# Patient Record
Sex: Male | Born: 1970 | Race: White | Hispanic: No | Marital: Single | Smoking: Current some day smoker
Health system: Southern US, Community
[De-identification: ages and names within clinical notes are randomized; demographics above are authoritative.]

## PROBLEM LIST (undated history)

## (undated) HISTORY — PX: SHOULDER ARTHROSCOPY: SHX128

---

## 2006-12-05 ENCOUNTER — Inpatient Hospital Stay (HOSPITAL_COMMUNITY): Admission: AD | Admit: 2006-12-05 | Discharge: 2006-12-09 | Payer: Self-pay | Admitting: Psychiatry

## 2006-12-06 ENCOUNTER — Ambulatory Visit: Payer: Self-pay | Admitting: Psychiatry

## 2007-01-14 ENCOUNTER — Encounter: Admission: RE | Admit: 2007-01-14 | Discharge: 2007-01-14 | Payer: Self-pay | Admitting: Neurology

## 2007-05-01 ENCOUNTER — Encounter (INDEPENDENT_AMBULATORY_CARE_PROVIDER_SITE_OTHER): Payer: Self-pay | Admitting: Surgery

## 2007-05-01 ENCOUNTER — Observation Stay (HOSPITAL_COMMUNITY): Admission: EM | Admit: 2007-05-01 | Discharge: 2007-05-02 | Payer: Self-pay | Admitting: Emergency Medicine

## 2007-08-03 ENCOUNTER — Emergency Department (HOSPITAL_COMMUNITY): Admission: EM | Admit: 2007-08-03 | Discharge: 2007-08-04 | Payer: Self-pay | Admitting: Emergency Medicine

## 2008-06-29 ENCOUNTER — Emergency Department (HOSPITAL_COMMUNITY): Admission: EM | Admit: 2008-06-29 | Discharge: 2008-06-29 | Payer: Self-pay | Admitting: Family Medicine

## 2011-02-13 NOTE — Op Note (Signed)
NAME:  Stephen Figueroa, Stephen Figueroa               ACCOUNT NO.:  [id number]   MEDICAL RECORD NO.:  [id number]          PATIENT TYPE:  INP   LOCATION:  1621                         FACILITY:  [facility]   PHYSICIAN:  Velora Heckler, MD      DATE OF BIRTH:  [birth year]   DATE OF PROCEDURE:  05/01/2007  DATE OF DISCHARGE:  05/02/2007                               OPERATIVE REPORT   PREOPERATIVE DIAGNOSIS:  Incarcerated umbilical hernia   POSTOPERATIVE DIAGNOSES:  1. Incarcerated umbilical hernia.  2. Abscess at umbilicus.   PROCEDURES:  1. Primary repair incarcerated umbilical hernia.  2. Drainage umbilical abscess.   SURGEON:  Velora Heckler, M.D., FACS   ANESTHESIA:  General, per Dr. Sherrian Divers.   ESTIMATED BLOOD LOSS:  Minimal.   PREPARATION:  Betadine.   COMPLICATIONS:  None.   INDICATIONS:  The patient is a 40 year old white male who presents to  the emergency department with a 4-day history of abdominal pain centered  on the umbilicus.  The patient had noted a mass beneath the umbilicus.  It has become progressively more painful.  He was seen today at urgent  medical care.  Laboratory studies were normal.  The patient was referred  for probable incarcerated umbilical hernia.   BODY OF REPORT:  Procedure was done in O.R. #1 at the [facility].  The patient was brought to the operating room and  placed in a supine position on the operating room table.  Following  administration of general anesthesia, the patient is prepped and draped  in the usual strict aseptic fashion.  After ascertaining that an  adequate level of anesthesia had been obtained, a transverse incision is  made just beneath the umbilicus.  Dissection is carried down through  subcutaneous tissues which showed moderate edema.  Dissection is carried  down to the fascia.  Fascial defect is defined.  Hernia sac is opened.  It contains creamy thick fluid consistent with abscess.  This is  cultured for aerobic  and anaerobic cultures and evacuated.  The hernia  sac is then transected, and the umbilicus was elevated off the abdominal  wall.  The abdominal wall was debrided down to normal fascia.  Fascial  defect measures less than 1 cm.  No further purulent fluid comes from  the peritoneal cavity.  Fascia is incised for 1 cm cephalad and 1 cm  caudad in order to visualize the peritoneal cavity.  A small amount of  omental tissue is debrided.  Peritoneum appears grossly normal.  Small  bowel appears normal.  No further drainage comes from the peritoneal  cavity.  The posterior aspect of the umbilicus is debrided.  There is a  fistula connecting from the abscess cavity through the skin at the base  of the umbilicus onto the abdominal wall.  Fistula was closed with a  figure-of-eight 3-0 Vicryl suture.  Wound is copiously irrigated with  warm saline.  Fascia is closed with interrupted #1 Novofil simple  sutures.  Umbilicus is then affixed to the abdominal wall with an  interrupted 3-0 Vicryl suture.  Subcutaneous  tissues are closed with interrupted 3-0 Vicryl sutures.  Skin was closed  with stainless steel staples.  Sterile dressings are applied.  The  patient is awakened from anesthesia and brought to the recovery room in  stable condition.  The patient tolerated the procedure well.      Velora Heckler, MD  Electronically Signed     TMG/MEDQ  D:  05/01/2007  T:  05/02/2007  Job:  [id number]   cc:   Benny Lennert, P.A.  [facility]

## 2011-02-13 NOTE — H&P (Signed)
NAME:  Stephen Figueroa               ACCOUNT NO.:  [id number]   MEDICAL RECORD NO.:  [id number]          PATIENT TYPE:  INP   LOCATION:  1621                         [facility]ACILITY:  [facility]   PHYSICIAN:  Velora Heckler, MD      DATE OF BIRTH:  [birth year]   DATE OF ADMISSION:  05/01/2007  DATE OF DISCHARGE:                              HISTORY & PHYSICAL   REASON FOR ADMISSION:  Abdominal pain, probable incarcerated umbilical  hernia.   HISTORY OF PRESENT ILLNESS:  Stephen Figueroa is a 61 year old white male  from [place], [place].  He presents at the request of Benny Lennert at urgent care in [place] with a 4-day history of abdominal  pain localized to the umbilicus.  The patient was seen and evaluated  there today.  The patient had been doing strenuous lifting and vigorous  physical activity over the previous weekend.  He developed pain 4 days  ago.  This persisted.  The patient noted local swelling and tenderness.  He presented for assessment.  He denies nausea or vomiting.  He has been  having normal bowel movements.  He denies fever.  The patient was seen  at urgent care and referred to general surgery for assessment.  The  patient came to [facility] Emergency Department for evaluation.   PAST MEDICAL HISTORY:  1. History of depression.  2. History of alcoholism.  3. Status post right shoulder surgery.   MEDICATIONS:  Depakote.   ALLERGIES:  None known.   SOCIAL HISTORY:  The patient works as a Geophysical data processor.  He lives  in [place].  He smokes a pack of cigarettes daily.  He has denies  current alcohol use and has been sober for 4 months.   FAMILY HISTORY:  Unremarkable.   A 15-system review is without significant other findings.   PHYSICAL EXAMINATION:  GENERAL:  A 40 year old, well-developed, well-  nourished white male in no acute distress.  VITAL SIGNS:  Temperature 98.5, pulse 74, respirations 16, blood  pressure 130/77.  HEENT:  Shows him to be  normocephalic, atraumatic.  Sclerae clear.  Conjunctiva clear.  Pupils equal, reactive.  Dentition good.  Mucous  membranes moist.  Voice normal.  NECK:  Anterior examination of the neck shows it to be symmetric.  Palpation shows no thyroid nodularity.  No anterior or posterior  cervical lymphadenopathy.  No supraclavicular masses.  LUNGS:  Clear to auscultation bilaterally.  CARDIAC:  Shows regular rate and rhythm, without murmur.  Peripheral  pulses are full.  EXTREMITIES:  Nontender, without edema.  ABDOMEN:  Examination the abdomen shows it to be soft without  distension.  Bowel sounds are present.  There are no surgical wounds.  There is slight erythema at the inferior aspect of the umbilicus.  There  is induration and a firm mass measuring approximately 4 cm beneath the  umbilicus.  This is exquisitely tender.  The remaining abdominal exam is  completely benign.  NEUROLOGIC:  The patient is alert and oriented.  There is no sign of  tremor.   LABORATORY STUDIES:  White count 9.8, hemoglobin  16, hematocrit 46%,  platelet count 151,000.  Differential is normal.  Urinalysis is  negative.  Electrolytes are normal.   EKG shows normal sinus rhythm.  No acute changes.   IMPRESSION:  Probable incarcerated umbilical hernia.   PLAN:  The patient will be admitted on the general surgical service at  [facility].  He will receive an initial dose of  intravenous antibiotics in preparation for surgery.  He will be taken to  the operating room later today for exploration and repair of hernia if  found.  The patient and I have discussed the possibility of resection of  a necrotic omentum.  We also discussed the possibility of bowel injury.  We also discussed the possibility of infection.  The patient understands  and wishes to proceed.      Velora Heckler, MD  Electronically Signed     TMG/MEDQ  D:  05/01/2007  T:  05/02/2007  Job:  [id number]   cc:   Benny Lennert   [facility]  [address]  [place]

## 2011-02-16 NOTE — Discharge Summary (Signed)
Stephen Figueroa, CLABO               ACCOUNT NO.:  [id number]   MEDICAL RECORD NO.:  [id number]          PATIENT TYPE:  IPS   LOCATION:  0307                          [facility]   PHYSICIAN:  Anselm Jungling, MD  DATE OF BIRTH:  [birth year]   DATE OF ADMISSION:  12/05/2006  DATE OF DISCHARGE:  12/09/2006                               DISCHARGE SUMMARY   IDENTIFYING DATA/REASON FOR ADMISSION:  This was a brief inpatient  psychiatric admission for Avel, a 40 year old male who requested  detoxification from alcohol, upon which he had become dependent.  He  also complained of depression and anxiety.  He had not been involved in  any treatment.  He had tried to get sober on his own but was unable to  do so in a sustained fashion.  Please refer to the admission note for  further details pertaining to the symptoms, circumstances and history  that led to his hospitalization.   INITIAL DIAGNOSTIC IMPRESSION:  He was given an initial AXIS I diagnoses  of alcohol dependence, and rule out mood disorder not otherwise  specified.   MEDICAL/LABORATORY:  The patient was medically and physically assessed  by the psychiatric nurse practitioner.  He was in good health without  any active or chronic medical problems.  There were no significant  medical issues.   HOSPITAL COURSE:  The patient was admitted to the adult inpatient  psychiatric service.  He presented as a well-nourished, well-developed  male who was bright, articulate, insightful, and open.  His thoughts and  speech were normally organized.  There were no signs or symptoms of  psychosis or thought disorder.  Although he complained of depression,  his degree of depression appeared to be appropriate to his situation and  circumstances, and there was no suicidal ideation.  He verbalized a  strong desire for help.   The patient was detoxified utilizing a Librium withdrawal protocol.  His  withdrawal from alcohol was uneventful.  He  participated in various  therapeutic groups and activities including those geared towards 12-step  recovery.   On the third hospital day, there was a family session involving the  patient and his sister.  Main concerns addressed were around finding a  place for the patient to continue his treatment after his  detoxification.  The patient expressed frustration over many different  issues, and had some difficulty taking responsibility for his actions or  feelings.  The patient did mention a desire to go to [facility] for  inpatient alcohol treatment.  Other options were discussed as well.   The patient was discharged on the fifth hospital day, having completed  the detoxification process without any medical sequelae.   AFTERCARE:  The patient was to follow up with the inpatient chemical  dependency treatment at [facility], to begin December 28, 2006, which  was the soonest date that they could accept him.  In the meantime, the  patient was going to attend Alcoholics Anonymous meetings frequently and  be in touch with an AA sponsor.   DISCHARGE MEDICATIONS:  None.   DISCHARGE DIAGNOSES:  AXIS I:  Alcohol dependence, early remission.  AXIS II:  Deferred.  AXIS III:  No acute or chronic illnesses.  AXIS IV:  Stressors:  Severe.  AXIS V:  GAF on discharge 65.      Anselm Jungling, MD  Electronically Signed     SPB/MEDQ  D:  01/01/2007  T:  01/01/2007  Job:  [id number]

## 2011-02-16 NOTE — H&P (Signed)
Stephen Figueroa, Stephen Figueroa               ACCOUNT NO.:  [id number]   MEDICAL RECORD NO.:  [id number]          PATIENT TYPE:  IPS   LOCATION:  0307                          [facility]   PHYSICIAN:  Anselm Jungling, MD  DATE OF BIRTH:  August 13, 1971   DATE OF ADMISSION:  12/05/2006  DATE OF DISCHARGE:                       PSYCHIATRIC ADMISSION ASSESSMENT   IDENTIFYING INFORMATION:  This is a 40 year old single white male.  This  is a voluntary admission.   HISTORY OF PRESENT ILLNESS:  This patient presented on referral from  Adrienne Mocha, the counselor, requesting detox from alcohol.  He reports  drinking 10-12 beers daily since age 71 with his longest period sober  being about two weeks last spring.  Endorses having some mood lability.  Getting depressed.  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  Having difficulty coping  with the responsibilities and began having some suicidal thoughts with a  plan to shoot himself which is, as he describes it, a recurring fantasy.  However, from time to time, has taken the safety off the gun and  currently does have access to guns at home.  He has no prior history of  suicide attempts.  Does have one prior psychiatric admission to [facility] at age 71 after doing some heavy drinking and was diagnosed  with ADHD and placed on stimulant medication at that time.  He has a  family history of substance abuse.  He is currently taking no  psychotropic medications and has never been under treatment for  depression or prior suicide attempts.   PAST PSYCHIATRIC HISTORY:  This is the patient's first admission to  [facility].  Past history is noted above.  He  currently has no care providers but was referred to [facility] Counseling  Service.   SOCIAL HISTORY:  Single white male, never married, no children.  Was  close to his father who died approximately 2-1/2 years ago.  Reports  a  family history of alcoholism.  He himself has a history of prior DWIs.  No current legal charges.  He currently is stressed by his relatively  new work responsibilities in which he has been in over the course of the  past six months and fears that his drinking is going to impair his job  performance and keep him from being successful.   MEDICAL HISTORY:  The patient has no regular primary care Alexxus Sobh.  Medical problems are none.  Past medical history is remarkable for no  history of DTs, no history of seizures or memory loss.  History of  blackouts is unclear.  He has also a history of right rotator cuff  repair otherwise healthy male.  Two prior hospital admissions, one for  the rotator cuff repair and one at [facility] age 15.   MEDICATIONS:  None.   ALLERGIES:  None.   POSITIVE PHYSICAL FINDINGS:  Well-nourished, well-developed male who is  in no acute distress.  He was admitted directly to the unit and  presented as a walk-in.  He is  5 feet 10 inches tall, weight 184 pounds,  temperature 99.2, pulse 82, respirations 18, blood pressure 154/81.  CIWA score at admission was noted at a 10.   REVIEW OF SYSTEMS:  The patient denies any abdominal pain.  No  hematemesis or bleeding in the bowels.  No changes in character of  stool.  Bowels are regular without medications.  He does wear corrective  lenses.  No chest pain.  No shortness of breath.  Denies formication.  Denies post nocturnal dyspnea and denies any cardiac palpitations and no  swelling of his extremities.  No nocturia.  No dysuria.  He denies any  risks of sexually transmitted disease.   PHYSICAL EXAMINATION:  GENERAL:  Well-nourished, well-developed male.  HEAD:  Normocephalic and atraumatic.  Grooming is good.  He is dressed  in Psychologist, counselling.  ENT:  PERRL.  Sclera nonicteric.  Extraocular movements within normal  limits.  No rhinorrhea.  Oropharynx is noninjected.  NECK:  Supple.  No thyromegaly.  CHEST:   Clear to auscultation.  BREAST:  Exam deferred.  CARDIOVASCULAR:  S1-S2 heard.  No clicks, murmurs, gallops or extra  sounds.  No murmurs are appreciated.  Apical pulse is regular and  synchronous with radial pulse.  ABDOMEN:  Soft, nontender, nondistended.  GENITOURINARY:  Deferred.  EXTREMITIES:  Full range of motion, strength is 5/5.  No signs of self-  mutilation or unusual scars.  He does have a right shoulder scar  reflecting his previous surgery.  SKIN:  Intact.  No rashes.  No lesions.  NEUROLOGIC:  Cranial nerves 2-12 are intact.  No nystagmus.  Extraocular  movements normal.  Motor and sensory are intact.  Romberg without  findings.  Gait is normal.  Neuro is nonfocal.   LABORATORY DATA:  CBC with WBC 9.5, hemoglobin 16.8, hematocrit 47.6,  platelets 174,000.  Chemistry with sodium 133, potassium 3.3, chloride  99, carbon dioxide 29, BUN 12, creatinine 0.88 and random glucose 95.  Liver enzymes with SGOT 24, SGPT 27 and alkaline phosphatase 52 and  total bilirubin 1.4, albumin is 3.9, calcium 8.9.  TSH 2.358.  Urine  drug screen negative for all substances.  UA within normal limits.   MENTAL STATUS EXAM:  Fully alert male, pleasant, cooperative, bright  affect.  Speech is fluent, articulate.  Mood is anxious.  Thought  process logical, coherent.  He has no intent for suicide.  Does have  access to a gun at home but denies intent to use it.  No homicidal  thought.  Insight is limited.  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  Has a lot of concerns about what his  alcohol addiction is going to mean for him socially.  Cognition is well-  preserved.   DIAGNOSES:  AXIS I:  Alcohol abuse and dependence.  AXIS II:  Deferred.  AXIS III:  Hypokalemia. AXIS IV:  Severe (work and social pressures).  AXIS V:   Current 40; past year 72.   PLAN:  To voluntarily admit the patient with 15-minute checks in place.  He plans to follow up with AA after discharge from here.  We started him  on a Librium protocol which he is tolerating well.  We also have made  available  to him Seroquel 100 mg q.2h. p.r.n. for agitation and he took  one dose last night.  We are going to replete his potassium with 40 mEq  of K-Dur today and he is taking food and fluids adequately.  He has  taken Ambien in the  past as needed for sleep when he had prior surgeries and we will make  that available to him here, Ambien 10 mg h.s. p.r.n. insomnia.  He is in  agreement with the plan.   ESTIMATED LENGTH OF STAY:  Four days.      Margaret A. Lorin Picket, N.P.      Anselm Jungling, MD  Electronically Signed    MAS/MEDQ  D:  12/06/2006  T:  12/06/2006  Job:  [id number]

## 2011-07-02 LAB — CULTURE, ROUTINE-ABSCESS: Culture: NO GROWTH

## 2011-07-10 LAB — BASIC METABOLIC PANEL
BUN: 5 — ABNORMAL LOW
Calcium: 8.9
Creatinine, Ser: 0.92
GFR calc non Af Amer: >60
Glucose, Bld: 102 — ABNORMAL HIGH
Sodium: 141

## 2011-07-10 LAB — RAPID URINE DRUG SCREEN, HOSP PERFORMED
Barbiturates: NOT DETECTED
Benzodiazepines: NOT DETECTED
Opiates: NOT DETECTED

## 2011-07-10 LAB — DIFFERENTIAL
Basophils Absolute: 0
Basophils Relative: 0
Eosinophils Absolute: 0.4
Eosinophils Relative: 4
Lymphocytes Relative: 31
Lymphs Abs: 3.3
Neutro Abs: 6.5

## 2011-07-10 LAB — CBC
Hemoglobin: 17.1 — ABNORMAL HIGH
MCV: 89.7

## 2011-07-16 LAB — DIFFERENTIAL
Eosinophils Absolute: 0.2
Eosinophils Relative: 2
Lymphocytes Relative: 17
Lymphs Abs: 1.7

## 2011-07-16 LAB — URINALYSIS, ROUTINE W REFLEX MICROSCOPIC
Nitrite: NEGATIVE
Protein, ur: NEGATIVE
pH: 8

## 2011-07-16 LAB — BASIC METABOLIC PANEL
Chloride: 105
Creatinine, Ser: 0.85
GFR calc Af Amer: >60
Glucose, Bld: 102 — ABNORMAL HIGH
Sodium: 139

## 2011-07-16 LAB — CBC
HCT: 46
RBC: 5.3
WBC: 9.8

## 2015-09-09 ENCOUNTER — Encounter: Payer: Self-pay | Admitting: Family Medicine

## 2015-09-09 ENCOUNTER — Ambulatory Visit
Admission: RE | Admit: 2015-09-09 | Discharge: 2015-09-09 | Disposition: A | Discharge status: Home / Self Care | Payer: 59 | Source: Ambulatory Visit | Attending: Family Medicine | Admitting: Family Medicine

## 2015-09-09 ENCOUNTER — Other Ambulatory Visit: Payer: Self-pay | Admitting: Family Medicine

## 2015-09-09 ENCOUNTER — Ambulatory Visit (INDEPENDENT_AMBULATORY_CARE_PROVIDER_SITE_OTHER): Payer: 59 | Admitting: Family Medicine

## 2015-09-09 VITALS — BP 120/70 | HR 96 | Temp 98.6°F | Resp 18 | Ht 69.5 in | Wt 244.0 lb

## 2015-09-09 DIAGNOSIS — R05 Cough: Secondary | ICD-10-CM | POA: Diagnosis not present

## 2015-09-09 DIAGNOSIS — R059 Cough, unspecified: Secondary | ICD-10-CM

## 2015-09-09 DIAGNOSIS — R748 Abnormal levels of other serum enzymes: Secondary | ICD-10-CM

## 2015-09-09 DIAGNOSIS — R509 Fever, unspecified: Secondary | ICD-10-CM | POA: Diagnosis not present

## 2015-09-09 DIAGNOSIS — R6889 Other general symptoms and signs: Secondary | ICD-10-CM | POA: Diagnosis not present

## 2015-09-09 DIAGNOSIS — R61 Generalized hyperhidrosis: Secondary | ICD-10-CM

## 2015-09-09 LAB — COMPREHENSIVE METABOLIC PANEL
ALK PHOS: 57 U/L (ref 40–115)
ALT: 73 U/L — AB (ref 9–46)
AST: 59 U/L — AB (ref 10–40)
Albumin: 3.4 g/dL — ABNORMAL LOW (ref 3.6–5.1)
BILIRUBIN TOTAL: 1.1 mg/dL (ref 0.2–1.2)
BUN: 10 mg/dL (ref 7–25)
CALCIUM: 8.7 mg/dL (ref 8.6–10.3)
CO2: 29 mmol/L (ref 20–31)
CREATININE: 1.05 mg/dL (ref 0.60–1.35)
Chloride: 99 mmol/L (ref 98–110)
GLUCOSE: 108 mg/dL — AB (ref 65–99)
Potassium: 4 mmol/L (ref 3.5–5.3)
Sodium: 135 mmol/L (ref 135–146)
Total Protein: 6 g/dL — ABNORMAL LOW (ref 6.1–8.1)

## 2015-09-09 LAB — POC INFLUENZA A&B (BINAX/QUICKVUE)
Influenza A, POC: NEGATIVE
Influenza B, POC: NEGATIVE

## 2015-09-09 LAB — GLUCOSE, POCT (MANUAL RESULT ENTRY): POC GLUCOSE: 145 mg/dL — AB (ref 70–99)

## 2015-09-09 MED ORDER — DOXYCYCLINE HYCLATE 100 MG PO TABS: 100.0000 mg | ORAL_TABLET | Freq: Two times a day (BID) | ORAL | Status: AC

## 2015-09-09 NOTE — Patient Instructions (Signed)
Your flu swab was negative today. We will let you know the results of your chest x-ray and blood work. Start taking the antibiotic and let me know if you're not back to normal after completing it. Make sure you are staying well hydrated and treat your symptoms. You can take Tylenol for fever and body aches.  Fever, Adult A fever is an increase in the body's temperature. It is usually defined as a temperature of 100F (38C) or higher. Brief mild or moderate fevers generally have no long-term effects, and they often do not require treatment. Moderate or high fevers may make you feel uncomfortable and can sometimes be a sign of a serious illness or disease. The sweating that may occur with repeated or prolonged fever may also cause dehydration. Fever is confirmed by taking a temperature with a thermometer. A measured temperature can vary with:  Age.  Time of day.  Location of the thermometer:  Mouth (oral).  Rectum (rectal).  Ear (tympanic).  Underarm (axillary).  Forehead (temporal). HOME CARE INSTRUCTIONS Pay attention to any changes in your symptoms. Take these actions to help with your condition:  Take over-the counter and prescription medicines only as told by your health care provider. Follow the dosing instructions carefully.  If you were prescribed an antibiotic medicine, take it as told by your health care provider. Do not stop taking the antibiotic even if you start to feel better.  Rest as needed.  Drink enough fluid to keep your urine clear or pale yellow. This helps to prevent dehydration.  Sponge yourself or bathe with room-temperature water to help reduce your body temperature as needed. Do not use ice water.  Do not overbundle yourself in blankets or heavy clothes. SEEK MEDICAL CARE IF:  You vomit.  You cannot eat or drink without vomiting.  You have diarrhea.  You have pain when you urinate.  Your symptoms do not improve with treatment.  You develop new  symptoms.  You develop excessive weakness. SEEK IMMEDIATE MEDICAL CARE IF:  You have shortness of breath or have trouble breathing.  You are dizzy or you faint.  You are disoriented or confused.  You develop signs of dehydration, such as a dry mouth, decreased urination, or paleness.  You develop severe pain in your abdomen.  You have persistent vomiting or diarrhea.  You develop a skin rash.  Your symptoms suddenly get worse.   This information is not intended to replace advice given to you by your health care provider. Make sure you discuss any questions you have with your health care provider.   Document Released: 03/13/2001 Document Revised: 06/08/2015 Document Reviewed: 11/11/2014 Elsevier Interactive Patient Education [employer]2016 Elsevier Inc.

## 2015-09-09 NOTE — Progress Notes (Signed)
   Subjective:    Patient ID: Stephen Figueroa, male    DOB: 19-Sep-1971, 44 y.o.   MRN: [id number]  HPI Chief Complaint  Patient presents with  . new pt    new pt, 10 day of headache, fever-100, congestion, fatigue, dizziness, no vomitting or nausea, no appetite, cough, sinus pressure. mucous   He is new to the practice and here to establish primary care. He also has an acute problem. Complains of 10 day history of sinus pressure, drainage, fever to 102 at home, sweating, productive cough, decreased appetite.  Sinus pressure improving. Denies sore throat, ear pain, GI or GU symptoms.  He is a smoker, positive sick contacts with child in home. He is immunocompetent.   Taking Tylenol, mucinex, sudafed. He is a smoker, socially, also uses chewing tobacco. Denies recreational drug use Alcohol use, reports binge drinking every 2 weeks and rehab in past for alcohol addiction.  No significant past medical history, right shoulder surgery. Has not had a primary care provider in past. Was seen at urgent care approximately 6 months ago for chest congestion and was given antibiotic at that time.  He has not had a physical exam in years or fasting blood work.   Due to allergies, medications, past medical, surgical and social history. Immunizations: Not on file. Patient reports flu shot 2 months ago.  Review of Systems Pertinent positives and negatives in the history of present illness.    Objective:   Physical Exam BP 120/70 mmHg  Pulse 96  Temp(Src) 98.6 F (37 C) (Oral)  Resp 18  Ht 5' 9.5" (1.765 m)  Wt 244 lb (110.678 kg)  BMI 35.53 kg/m2 Alert and oriented in no acute distress. Skin is warm, diaphoretic. Tympanic membranes and canals are normal. Pharyngeal area is normal. Neck is supple without adenopathy. Cardiac exam shows a regular sinus rhythm without murmurs or gallops. Lungs with decreased breath sounds to RUL, otherwise clear to auscultation.  Glucose finger stick: 145 Flu swab:  Negative      Assessment & Plan:  Fever, unspecified fever cause - Plan: CBC with Differential/Platelet, Comprehensive metabolic panel, POC Influenza A&B, DG Chest 2 View  Flu-like symptoms - Plan: CBC with Differential/Platelet, Comprehensive metabolic panel, POC Influenza A&B  Diaphoresis - Plan: CBC with Differential/Platelet, Comprehensive metabolic panel, Glucose (CBG)  Cough - Plan: DG Chest 2 View  Discussed that pneumonia is in the list of differentials along with influenza and sinus infection and should be ruled out based on symptoms. Also discussed symptomatic treatment and he will let me know if not improving in next 2-3 days or if his symptoms get worse. He should return back to baseline after completing the antibiotic and will let me know if he is not. Whenever he is feeling better I recommend that he return for a complete physical exam and fasting blood work. Spent a minimum of 30 minutes with patient and 50% of that time was in counseling and coordination of care.

## 2015-09-10 LAB — CBC WITH DIFFERENTIAL/PLATELET
BASOS ABS: 0.1 10*3/uL (ref 0.0–0.1)
Basophils Relative: 1 % (ref 0–1)
EOS PCT: 1 % (ref 0–5)
Eosinophils Absolute: 0.1 10*3/uL (ref 0.0–0.7)
HEMATOCRIT: 40.3 % (ref 39.0–52.0)
HEMOGLOBIN: 13.8 g/dL (ref 13.0–17.0)
LYMPHS PCT: 42 % (ref 12–46)
Lymphs Abs: 3.1 10*3/uL (ref 0.7–4.0)
MCH: 30.1 pg (ref 26.0–34.0)
MCHC: 34.2 g/dL (ref 30.0–36.0)
MCV: 88 fL (ref 78.0–100.0)
MPV: 11 fL (ref 8.6–12.4)
Monocytes Absolute: 0.6 10*3/uL (ref 0.1–1.0)
Monocytes Relative: 8 % (ref 3–12)
NEUTROS ABS: 3.6 10*3/uL (ref 1.7–7.7)
Neutrophils Relative %: 48 % (ref 43–77)
Platelets: 164 10*3/uL (ref 150–400)
RBC: 4.58 MIL/uL (ref 4.22–5.81)
RDW: 14.4 % (ref 11.5–15.5)
WBC: 7.4 10*3/uL (ref 4.0–10.5)

## 2015-09-12 LAB — HEPATITIS PANEL, ACUTE
HCV AB: NEGATIVE
HEP A IGM: NONREACTIVE
HEP B S AG: NEGATIVE
Hep B C IgM: NONREACTIVE

## 2015-09-12 LAB — HEMOGLOBIN A1C
Hgb A1c MFr Bld: 5.1 % (ref <5.7)
Mean Plasma Glucose: 100 mg/dL (ref <117)

## 2015-09-13 NOTE — Addendum Note (Signed)
Addended by: Herminio CommonsJOHNSON, Mikiya Nebergall A on: 09/13/2015 11:21 AM   Modules accepted: Orders

## 2017-06-29 IMAGING — CR DG CHEST 2V
2 series · 2 of 2 positions shown · non-contrast
Comparison: PA and lateral chest x-ray.

CLINICAL DATA: Ten days of fever, productive cough, and hoarseness,
patient is a current smoker.

EXAM:
CHEST  2 VIEW

[w chest pa]
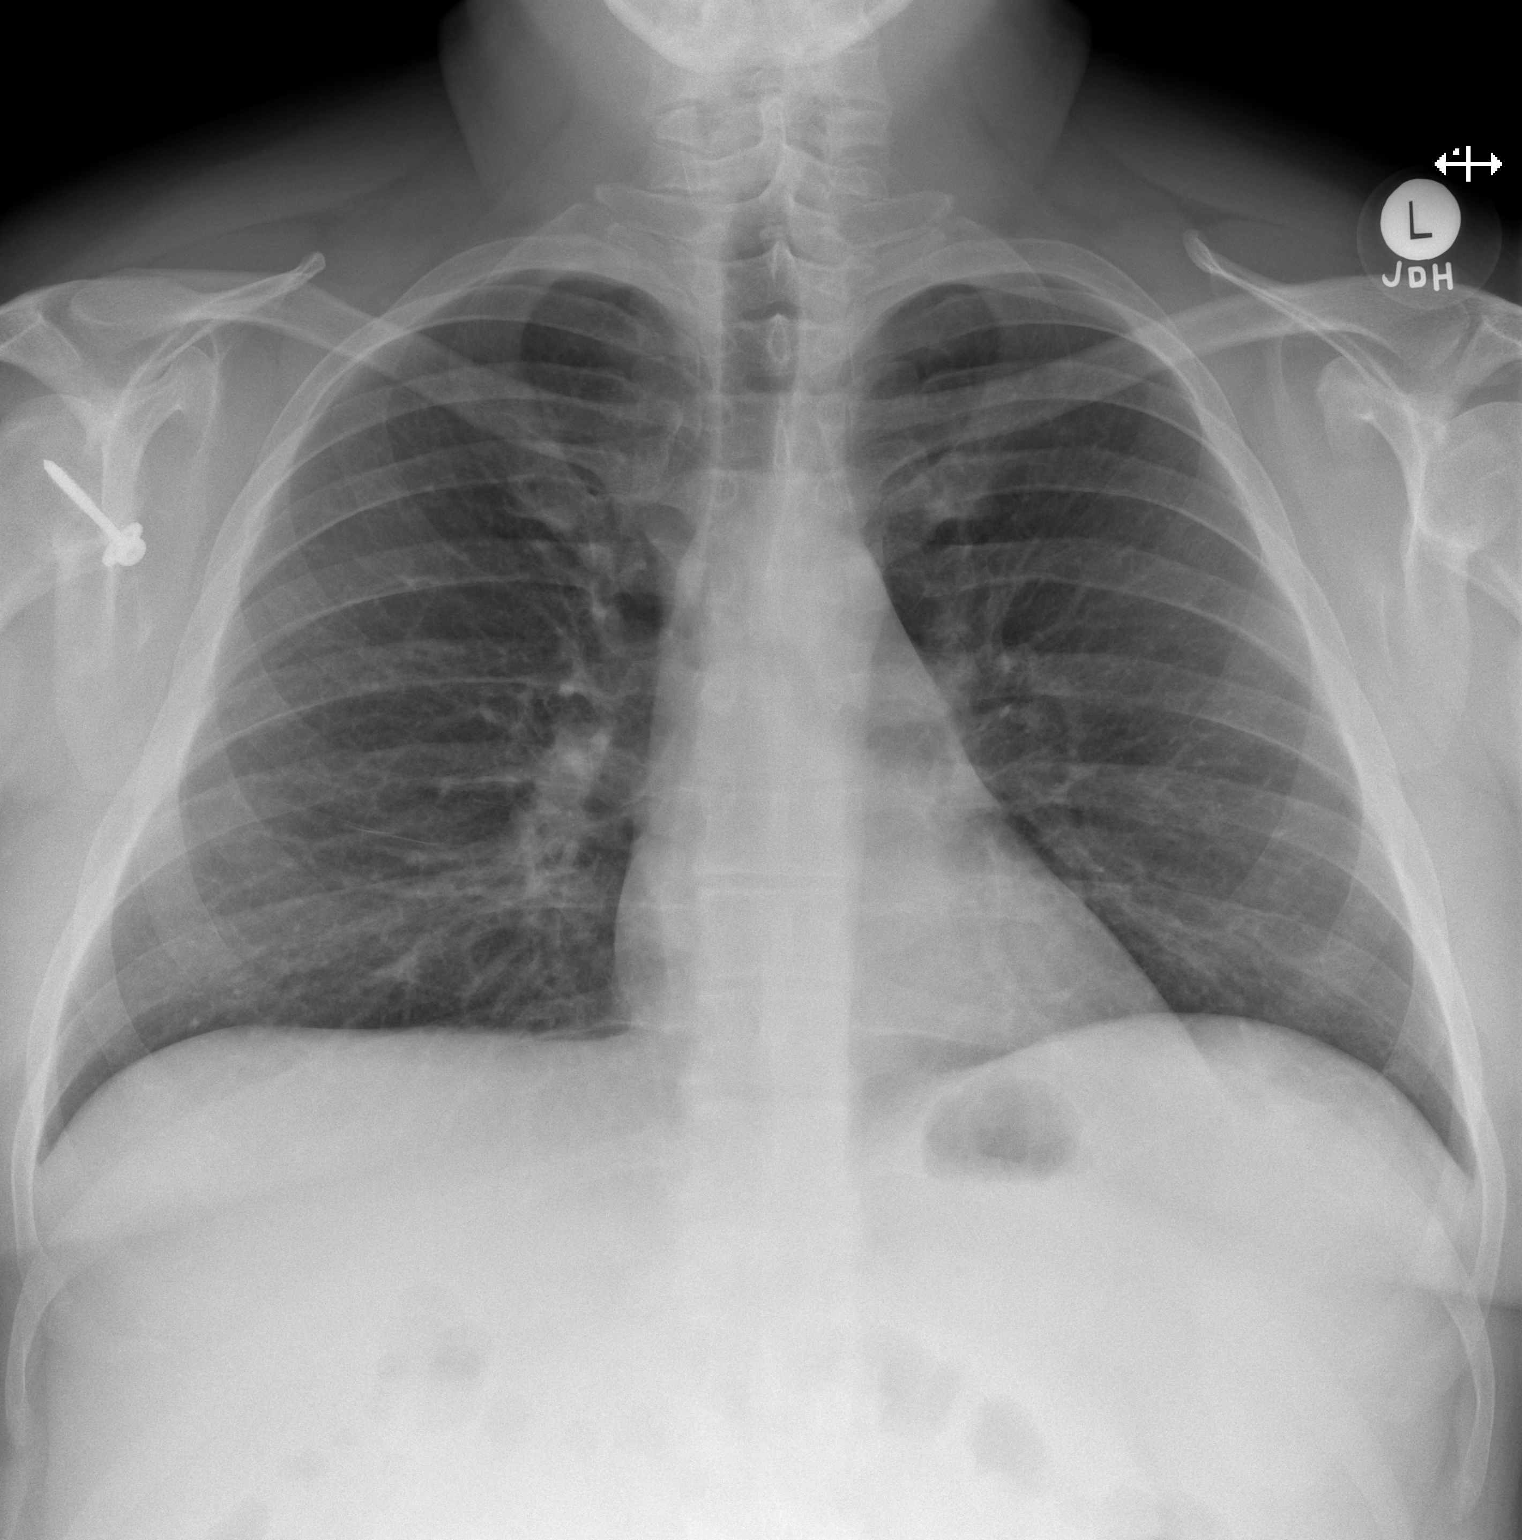

[w chest lat]
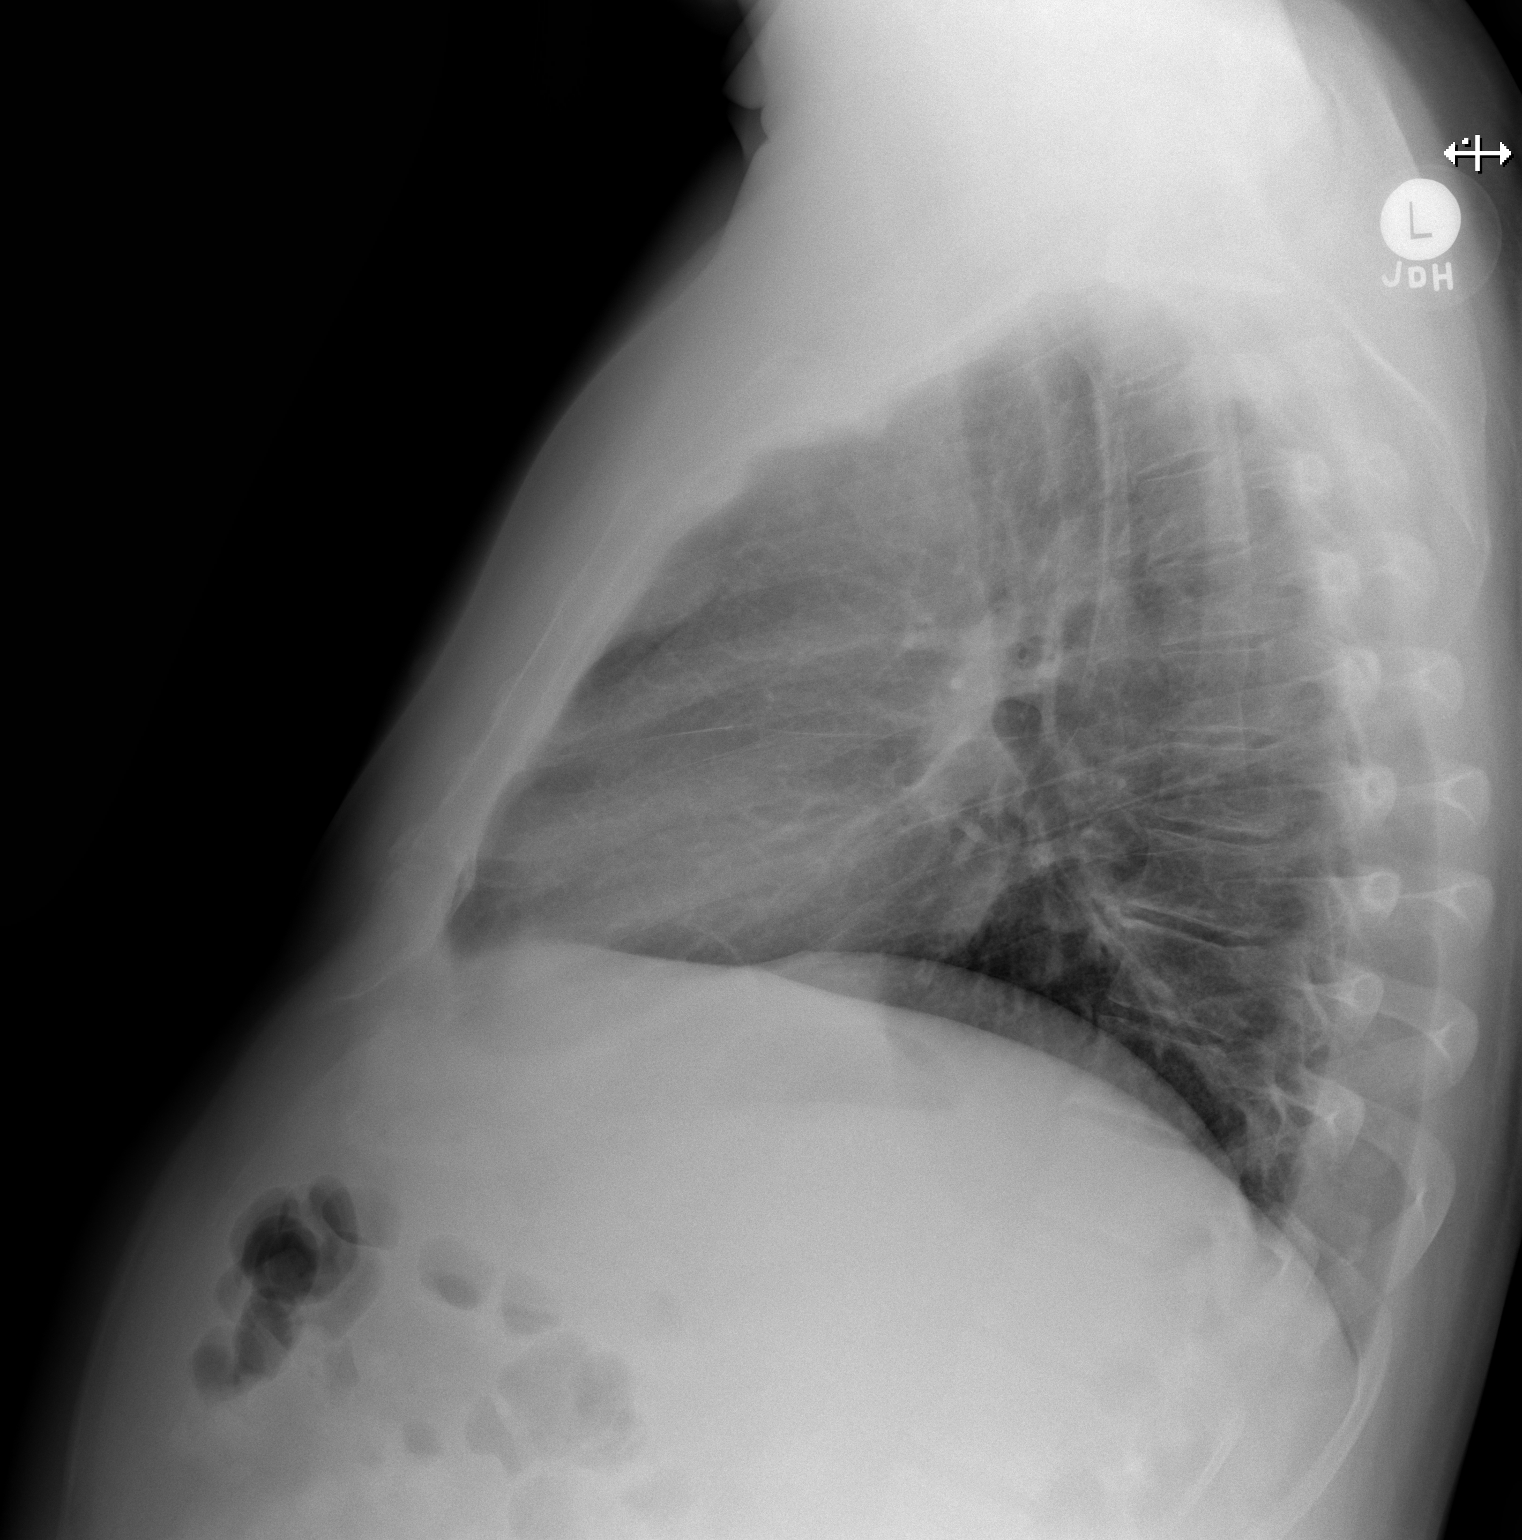

[2 of 2 positions shown; findings below may reference images not displayed]

FINDINGS: The lungs are adequately inflated and clear. The heart and pulmonary
vascularity are normal. The mediastinum is normal in width. There is
no pleural effusion. The bony thorax exhibits no acute abnormality.
IMPRESSION: There is no evidence of pneumonia nor other active cardiopulmonary
disease.
